# Patient Record
Sex: Female | Born: 1970 | Race: White | Hispanic: No | Marital: Married | Smoking: Never smoker
Health system: Southern US, Community
[De-identification: ages and names within clinical notes are randomized; demographics above are authoritative.]

## PROBLEM LIST (undated history)

## (undated) HISTORY — PX: KNEE SURGERY: SHX244

---

## 2004-05-13 ENCOUNTER — Ambulatory Visit: Payer: Self-pay | Admitting: Internal Medicine

## 2008-01-12 ENCOUNTER — Ambulatory Visit: Payer: Self-pay | Admitting: Internal Medicine

## 2008-01-12 LAB — CONVERTED CEMR LAB
Blood in Urine, dipstick: NEGATIVE
Nitrite: NEGATIVE
Protein, U semiquant: NEGATIVE
Urobilinogen, UA: 0.2
WBC Urine, dipstick: NEGATIVE
pH: 7.5

## 2017-07-11 ENCOUNTER — Encounter (HOSPITAL_COMMUNITY): Payer: Self-pay | Admitting: Emergency Medicine

## 2017-07-11 ENCOUNTER — Emergency Department (HOSPITAL_COMMUNITY)
Admission: EM | Admit: 2017-07-11 | Discharge: 2017-07-11 | Disposition: A | Discharge status: Home / Self Care | Payer: BC Managed Care – PPO | Attending: Emergency Medicine | Admitting: Emergency Medicine

## 2017-07-11 ENCOUNTER — Emergency Department (HOSPITAL_COMMUNITY): Payer: BC Managed Care – PPO

## 2017-07-11 DIAGNOSIS — M542 Cervicalgia: Secondary | ICD-10-CM | POA: Diagnosis not present

## 2017-07-11 DIAGNOSIS — R1010 Upper abdominal pain, unspecified: Secondary | ICD-10-CM | POA: Diagnosis not present

## 2017-07-11 DIAGNOSIS — R0789 Other chest pain: Secondary | ICD-10-CM | POA: Diagnosis not present

## 2017-07-11 DIAGNOSIS — Y92410 Unspecified street and highway as the place of occurrence of the external cause: Secondary | ICD-10-CM | POA: Diagnosis not present

## 2017-07-11 DIAGNOSIS — Y9389 Activity, other specified: Secondary | ICD-10-CM | POA: Insufficient documentation

## 2017-07-11 DIAGNOSIS — M25512 Pain in left shoulder: Secondary | ICD-10-CM | POA: Diagnosis not present

## 2017-07-11 DIAGNOSIS — Y998 Other external cause status: Secondary | ICD-10-CM | POA: Insufficient documentation

## 2017-07-11 DIAGNOSIS — S299XXA Unspecified injury of thorax, initial encounter: Secondary | ICD-10-CM | POA: Diagnosis present

## 2017-07-11 DIAGNOSIS — S2232XA Fracture of one rib, left side, initial encounter for closed fracture: Secondary | ICD-10-CM

## 2017-07-11 LAB — I-STAT CHEM 8, ED
BUN: 8 mg/dL (ref 6–20)
Calcium, Ion: 1.26 mmol/L (ref 1.15–1.40)
Chloride: 103 mmol/L (ref 101–111)
Creatinine, Ser: 0.6 mg/dL (ref 0.44–1.00)
Glucose, Bld: 99 mg/dL (ref 65–99)
HEMATOCRIT: 41 % (ref 36.0–46.0)
HEMOGLOBIN: 13.9 g/dL (ref 12.0–15.0)
POTASSIUM: 3.8 mmol/L (ref 3.5–5.1)
Sodium: 142 mmol/L (ref 135–145)
TCO2: 26 mmol/L (ref 22–32)

## 2017-07-11 LAB — I-STAT BETA HCG BLOOD, ED (MC, WL, AP ONLY)

## 2017-07-11 MED ORDER — METHOCARBAMOL 500 MG PO TABS
500.0000 mg | ORAL_TABLET | Freq: Once | ORAL | Status: AC
  Administered 2017-07-11: 500 mg via ORAL
  Filled 2017-07-11: qty 1

## 2017-07-11 MED ORDER — IBUPROFEN 800 MG PO TABS: 800.0000 mg | ORAL_TABLET | Freq: Three times a day (TID) | ORAL | 0 refills | Status: AC

## 2017-07-11 MED ORDER — KETOROLAC TROMETHAMINE 30 MG/ML IJ SOLN
30.0000 mg | Freq: Once | INTRAMUSCULAR | Status: AC
  Administered 2017-07-11: 30 mg via INTRAVENOUS
  Filled 2017-07-11: qty 1

## 2017-07-11 MED ORDER — CYCLOBENZAPRINE HCL 10 MG PO TABS: 10.0000 mg | ORAL_TABLET | Freq: Two times a day (BID) | ORAL | 0 refills | Status: AC | PRN

## 2017-07-11 MED ORDER — OXYCODONE-ACETAMINOPHEN 5-325 MG PO TABS: 1.0000 | ORAL_TABLET | Freq: Three times a day (TID) | ORAL | 0 refills | Status: AC | PRN

## 2017-07-11 MED ORDER — ACETAMINOPHEN 325 MG PO TABS
650.0000 mg | ORAL_TABLET | Freq: Once | ORAL | Status: AC
  Administered 2017-07-11: 650 mg via ORAL
  Filled 2017-07-11: qty 2

## 2017-07-11 MED ORDER — IOHEXOL 300 MG/ML  SOLN
100.0000 mL | Freq: Once | INTRAMUSCULAR | Status: AC | PRN
  Administered 2017-07-11: 100 mL via INTRAVENOUS

## 2017-07-11 NOTE — ED Notes (Signed)
Family at bedside. 
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
## 2017-07-11 NOTE — ED Notes (Signed)
Pt given incentive spirometer to take home, teaching provided on use.
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
## 2017-07-11 NOTE — ED Notes (Signed)
Patient transported to CT 
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
